# Patient Record
Sex: Male | Born: 1995 | Race: White | Hispanic: No | Marital: Married | Smoking: Never smoker
Health system: Southern US, Community
[De-identification: ages and names within clinical notes are randomized; demographics above are authoritative.]

## PROBLEM LIST (undated history)

## (undated) DIAGNOSIS — N289 Disorder of kidney and ureter, unspecified: Secondary | ICD-10-CM

---

## 2017-12-25 ENCOUNTER — Emergency Department (HOSPITAL_COMMUNITY)
Admission: EM | Admit: 2017-12-25 | Discharge: 2017-12-26 | Disposition: A | Discharge status: Home / Self Care | Payer: BLUE CROSS/BLUE SHIELD | Attending: Emergency Medicine | Admitting: Emergency Medicine

## 2017-12-25 ENCOUNTER — Encounter (HOSPITAL_COMMUNITY): Payer: Self-pay | Admitting: Emergency Medicine

## 2017-12-25 DIAGNOSIS — N289 Disorder of kidney and ureter, unspecified: Secondary | ICD-10-CM | POA: Insufficient documentation

## 2017-12-25 DIAGNOSIS — R1031 Right lower quadrant pain: Secondary | ICD-10-CM | POA: Diagnosis present

## 2017-12-25 HISTORY — DX: Disorder of kidney and ureter, unspecified: N28.9

## 2017-12-25 LAB — COMPREHENSIVE METABOLIC PANEL
ALT: 191 U/L — AB (ref 0–44)
AST: 69 U/L — AB (ref 15–41)
Albumin: 4.2 g/dL (ref 3.5–5.0)
Alkaline Phosphatase: 73 U/L (ref 38–126)
Anion gap: 8 (ref 5–15)
BUN: 20 mg/dL (ref 6–20)
CHLORIDE: 109 mmol/L (ref 98–111)
CO2: 23 mmol/L (ref 22–32)
CREATININE: 1.09 mg/dL (ref 0.61–1.24)
Calcium: 10.1 mg/dL (ref 8.9–10.3)
Glucose, Bld: 102 mg/dL — ABNORMAL HIGH (ref 70–99)
POTASSIUM: 4 mmol/L (ref 3.5–5.1)
SODIUM: 140 mmol/L (ref 135–145)
Total Bilirubin: 0.4 mg/dL (ref 0.3–1.2)
Total Protein: 6.9 g/dL (ref 6.5–8.1)

## 2017-12-25 LAB — URINALYSIS, ROUTINE W REFLEX MICROSCOPIC
Bilirubin Urine: NEGATIVE
GLUCOSE, UA: NEGATIVE mg/dL
Hgb urine dipstick: NEGATIVE
Ketones, ur: NEGATIVE mg/dL
LEUKOCYTES UA: NEGATIVE
NITRITE: NEGATIVE
PH: 5 (ref 5.0–8.0)
Protein, ur: NEGATIVE mg/dL
SPECIFIC GRAVITY, URINE: 1.025 (ref 1.005–1.030)

## 2017-12-25 LAB — CBC
HEMATOCRIT: 46.7 % (ref 39.0–52.0)
HEMOGLOBIN: 15.7 g/dL (ref 13.0–17.0)
MCH: 28.7 pg (ref 26.0–34.0)
MCHC: 33.6 g/dL (ref 30.0–36.0)
MCV: 85.4 fL (ref 80.0–100.0)
NRBC: 0 % (ref 0.0–0.2)
Platelets: 340 10*3/uL (ref 150–400)
RBC: 5.47 MIL/uL (ref 4.22–5.81)
RDW: 12.3 % (ref 11.5–15.5)
WBC: 8.1 10*3/uL (ref 4.0–10.5)

## 2017-12-25 LAB — LIPASE, BLOOD: LIPASE: 27 U/L (ref 11–51)

## 2017-12-25 NOTE — ED Triage Notes (Signed)
Pt presents to ED for assessment of RLQ pain x 3 days, worse with movement and ambulation.  Small amount of diarrhea, denies changes in urination, c/o intermittent nausea, denies vomiting.

## 2017-12-26 ENCOUNTER — Emergency Department (HOSPITAL_COMMUNITY): Payer: BLUE CROSS/BLUE SHIELD

## 2017-12-26 MED ORDER — IOHEXOL 300 MG/ML  SOLN
100.0000 mL | Freq: Once | INTRAMUSCULAR | Status: AC | PRN
  Administered 2017-12-26: 100 mL via INTRAVENOUS

## 2017-12-26 NOTE — ED Provider Notes (Signed)
[facility] EMERGENCY DEPARTMENT Provider Note   CSN: [id number] Arrival date & time: 12/25/17  2132     History   Chief Complaint Chief Complaint  Patient presents with  . Abdominal Pain    HPI OSA CAMPOLI is a 22 y.o. male.  The history is provided by the patient and medical records.  Abdominal Pain      22 year old male presenting to the ED with right lower quadrant abdominal pain.  Has been ongoing for about 3 days, but got acutely worse today.  Pain is worse with movement and ambulation.  Has had a little bit of diarrhea with some intermittent nausea.  No vomiting.  Denies any difficulty urinating or hematuria.  He has not had any fever or chills.  He was seen at urgent care today and sent to the ED for CT scan with concern of appendicitis.  No prior abdominal surgeries.  No meds taken prior to arrival.  Past Medical History:  Diagnosis Date  . Renal disorder    ureter    There are no active problems to display for this patient.   History reviewed. No pertinent surgical history.      Home Medications    Prior to Admission medications   Not on File    Family History History reviewed. No pertinent family history.  Social History Social History   Tobacco Use  . Smoking status: Never Smoker  . Smokeless tobacco: Never Used  Substance Use Topics  . Alcohol use: Not Currently  . Drug use: Never     Allergies   Patient has no allergy information on record.   Review of Systems Review of Systems  Gastrointestinal: Positive for abdominal pain.  All other systems reviewed and are negative.    Physical Exam Updated Vital Signs BP (!) 147/98 (BP Location: Left Arm)   Pulse 78   Temp 98.1 F (36.7 C) (Oral)   Resp 16   SpO2 94%   Physical Exam  Constitutional: He is oriented to person, place, and time. He appears well-developed and well-nourished.  HENT:  Head: Normocephalic and atraumatic.  Mouth/Throat: Oropharynx is  clear and moist.  Eyes: Pupils are equal, round, and reactive to light. Conjunctivae and EOM are normal.  Neck: Normal range of motion.  Cardiovascular: Normal rate, regular rhythm and normal heart sounds.  Pulmonary/Chest: Effort normal and breath sounds normal.  Abdominal: Soft. Bowel sounds are normal. There is tenderness (mild) in the right lower quadrant. There is no rigidity and no guarding.  Musculoskeletal: Normal range of motion.  Neurological: He is alert and oriented to person, place, and time.  Skin: Skin is warm and dry.  Psychiatric: He has a normal mood and affect.  Nursing note and vitals reviewed.    ED Treatments / Results  Labs (all labs ordered are listed, but only abnormal results are displayed) Labs Reviewed  COMPREHENSIVE METABOLIC PANEL - Abnormal; Notable for the following components:      Result Value   Glucose, Bld 102 (*)    AST 69 (*)    ALT 191 (*)    All other components within normal limits  LIPASE, BLOOD  CBC  URINALYSIS, ROUTINE W REFLEX MICROSCOPIC    EKG None  Radiology Ct Abdomen Pelvis W Contrast  Result Date: 12/26/2017 CLINICAL DATA:  Right lower quadrant pain for 3 days. EXAM: CT ABDOMEN AND PELVIS WITH CONTRAST TECHNIQUE: Multidetector CT imaging of the abdomen and pelvis was performed using the standard protocol  following bolus administration of intravenous contrast. CONTRAST:  OMNIPAQUE IOHEXOL 300 MG/ML  SOLN COMPARISON:  None. FINDINGS: Lower chest: The lung bases are clear. Hepatobiliary: Prominent diffuse fatty infiltration of the liver. No focal lesions. Gallbladder and bile ducts are unremarkable. Pancreas: Unremarkable. No pancreatic ductal dilatation or surrounding inflammatory changes. Spleen: Normal in size without focal abnormality. Adrenals/Urinary Tract: No adrenal gland nodules. Kidneys are symmetrical with homogeneous nephrograms. Prominent extrarenal pelvises bilaterally. No hydronephrosis or hydroureter. Bladder is  unremarkable. Stomach/Bowel: Stomach is within normal limits. Appendix appears normal. No evidence of bowel wall thickening, distention, or inflammatory changes. Vascular/Lymphatic: No significant vascular findings are present. No enlarged abdominal or pelvic lymph nodes. Reproductive: Prostate is unremarkable. Other: No abdominal wall hernia or abnormality. No abdominopelvic ascites. Musculoskeletal: No acute or significant osseous findings. IMPRESSION: 1. Prominent diffuse fatty infiltration of the liver. 2. Normal appendix. 3. Prominent extrarenal pelvises bilaterally without hydronephrosis or hydroureter. 4. No bowel obstruction or inflammation. Electronically Signed   By: Burman Nieves M.D.   On: 12/26/2017 03:18    Procedures Procedures (including critical care time)  Medications Ordered in ED Medications - No data to display   Initial Impression / Assessment and Plan / ED Course  I have reviewed the triage vital signs and the nursing notes.  Pertinent labs & imaging results that were available during my care of the patient were reviewed by me and considered in my medical decision making (see chart for details).  22 year old male presenting to the ED with right lower abdominal pain for the past 3 days.  He is afebrile and nontoxic.  Exam with very minimal tenderness in the right lower abdomen.  No peritoneal signs.  Labs reviewed, does have some elevation of LFTs.  He reports history of same, seen by GI and felt to be related to fatty liver.  He denies any excessive Tylenol or alcohol intake.  He is not on a statin.  He denies any recent travel or risk factors for hepatitis.  No hx of IVDU.  Will plan for CT scan.  CT scan without acute findings.  Does show signs of fatty liver.  Patient stable for discharge.  Offered pain medications for symptomatic control, patient declined.  Will follow-up with his GI doctor for recheck of LFT's in the next few weeks.  Return here for any new/acute  changes.  Final Clinical Impressions(s) / ED Diagnoses   Final diagnoses:  RLQ abdominal pain    ED Discharge Orders    None       Garlon Hatchet, PA-C 12/26/17 1610    Gilda Crease, MD 12/27/17 [phone]

## 2017-12-26 NOTE — Discharge Instructions (Signed)
CT today was normal. Liver enzymes were elevated today-- re-check in a few weeks for monitoring. Return here for any new/acute changes.

## 2019-08-01 IMAGING — CT CT ABD-PELV W/ CM
2 of 4 series · 17 of 46 positions shown, 19 images · IV contrast (APPLIED)
Comparison: None.

CLINICAL DATA: Right lower quadrant pain for 3 days.

EXAM:
CT ABDOMEN AND PELVIS WITH CONTRAST
TECHNIQUE: Multidetector CT imaging of the abdomen and pelvis was performed
using the standard protocol following bolus administration of
intravenous contrast.
CONTRAST:  100mL OMNIPAQUE IOHEXOL 300 MG/ML  SOLN

[Series 3: abdomen 5.0 · axial · 0.81mm/px · z∈[+880,+1345]mm · 14 of 105 slices shown, 16 images]
[im 6/105  soft-tissue]
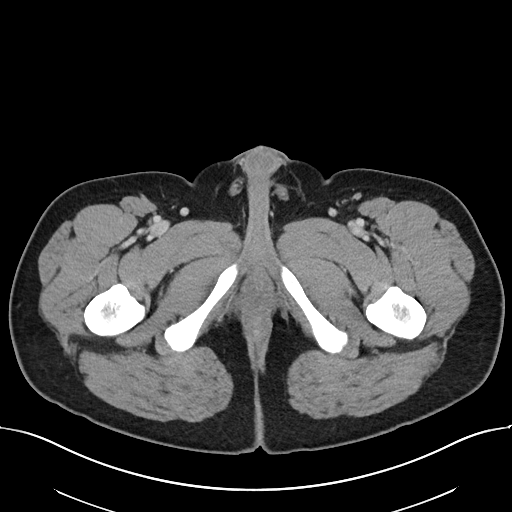
[im 6/105  bone]
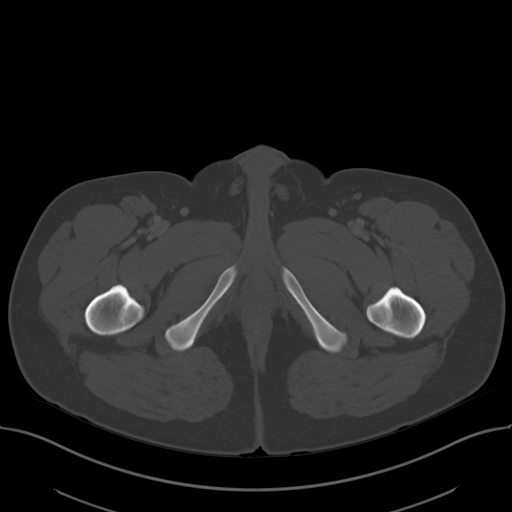
[im 12/105  soft-tissue]
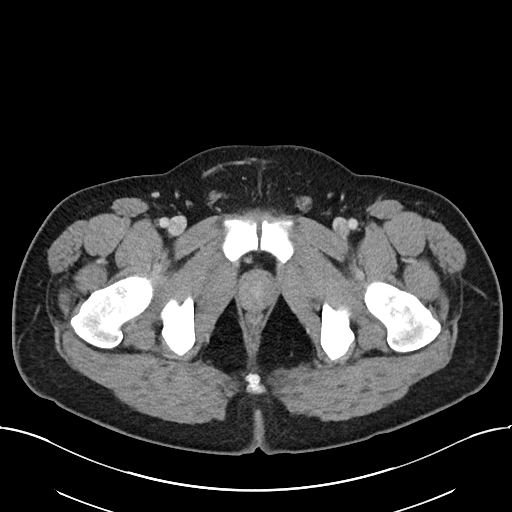
[im 24/105  soft-tissue]
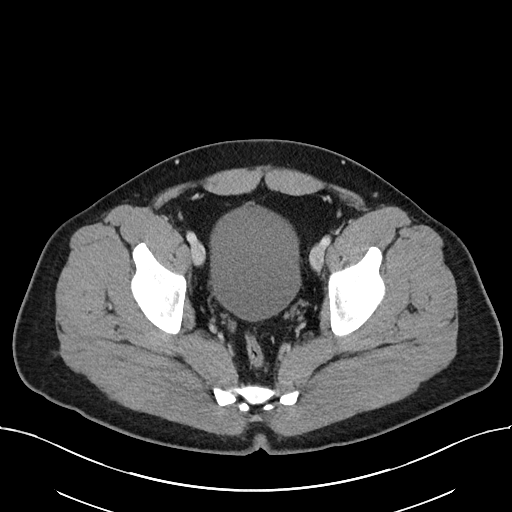
[im 29/105  soft-tissue]
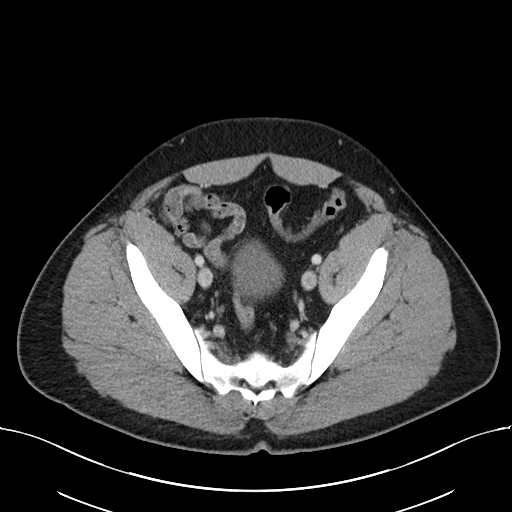
[im 35/105  soft-tissue]
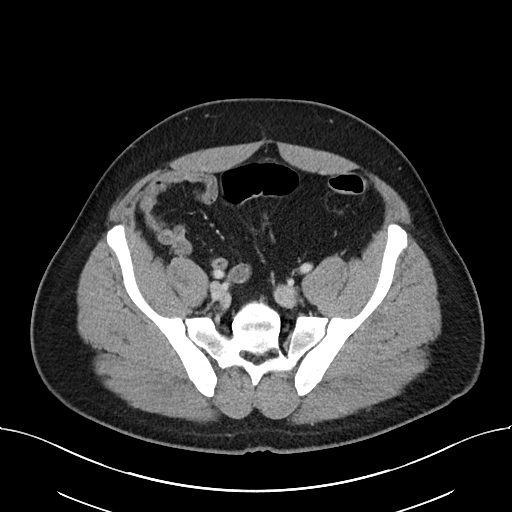
[im 41/105  soft-tissue]
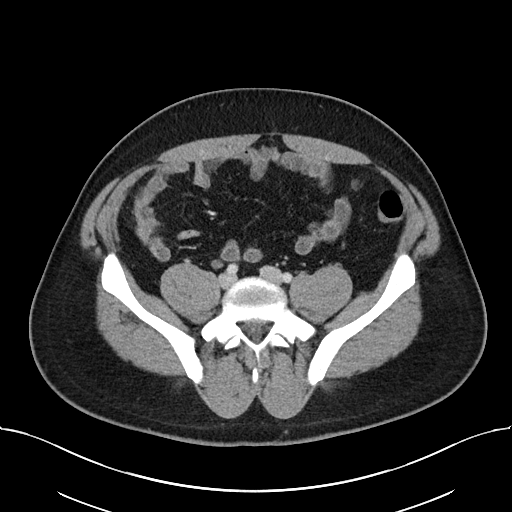
[im 47/105  soft-tissue]
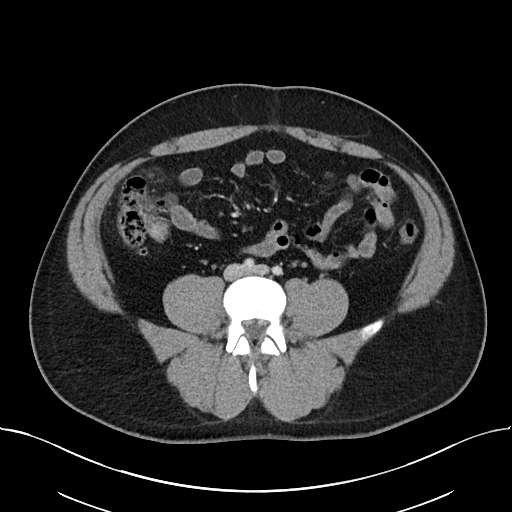
[im 58/105  soft-tissue]
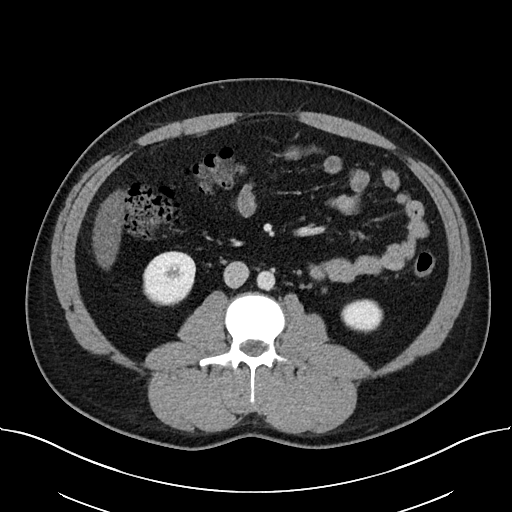
[im 64/105  soft-tissue]
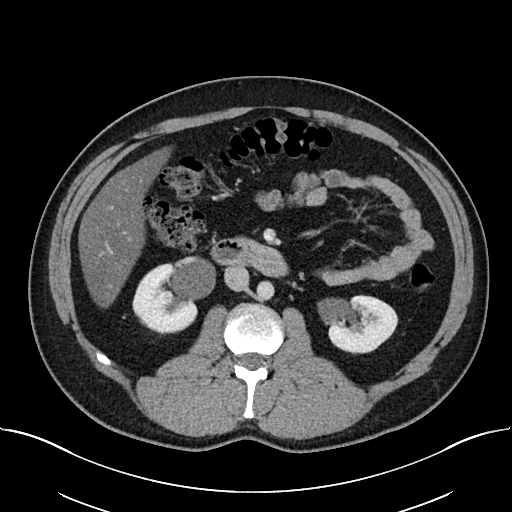
[im 64/105  bone]
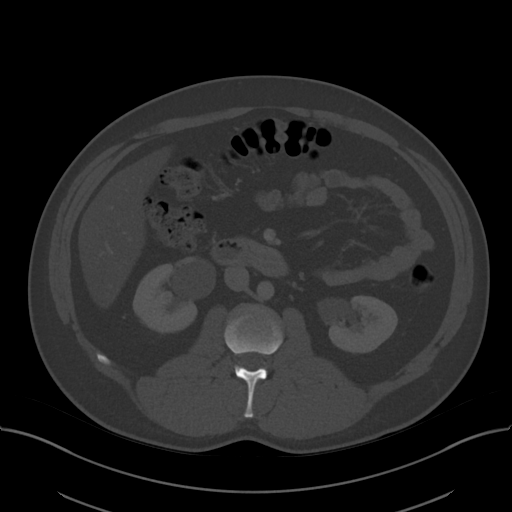
[im 70/105  soft-tissue]
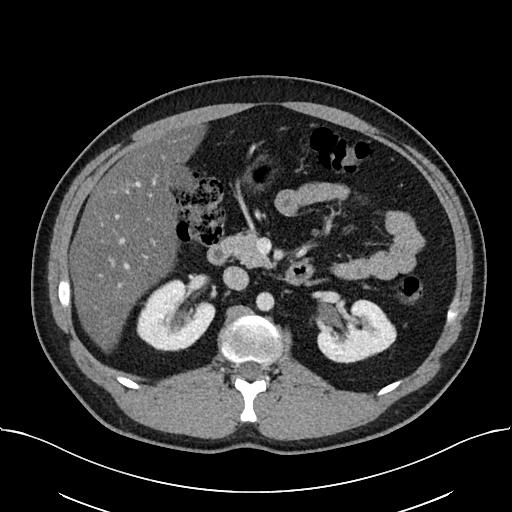
[im 76/105  soft-tissue]
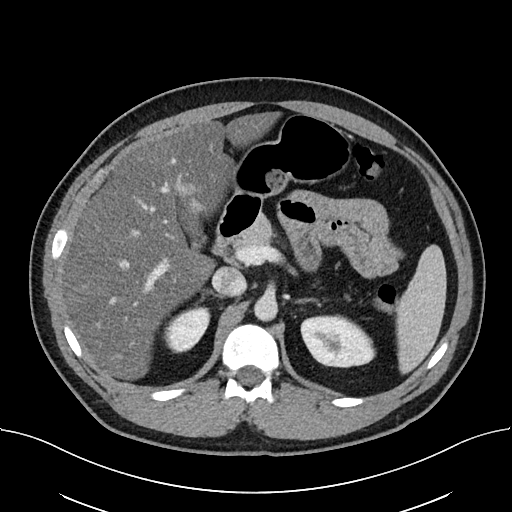
[im 81/105  soft-tissue]
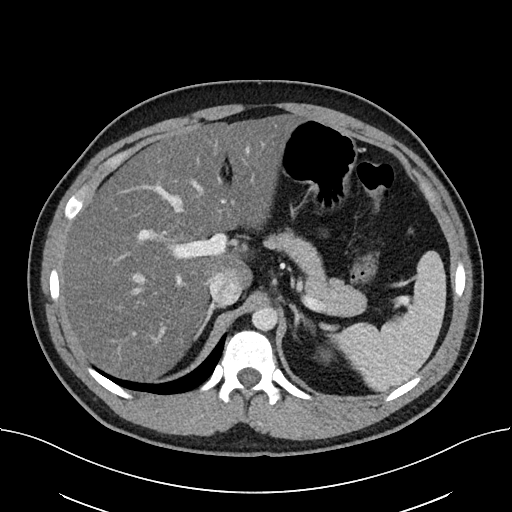
[im 93/105  soft-tissue]
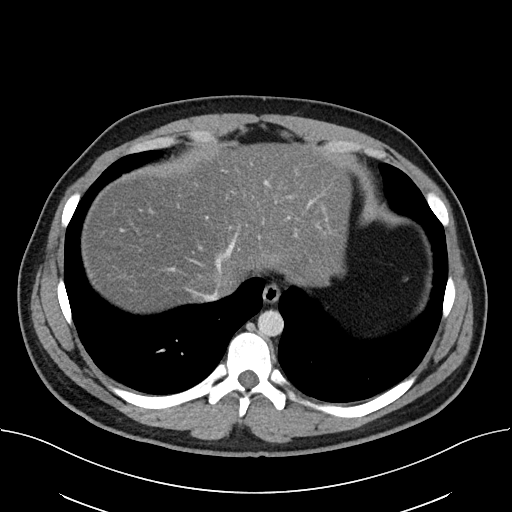
[im 99/105  soft-tissue]
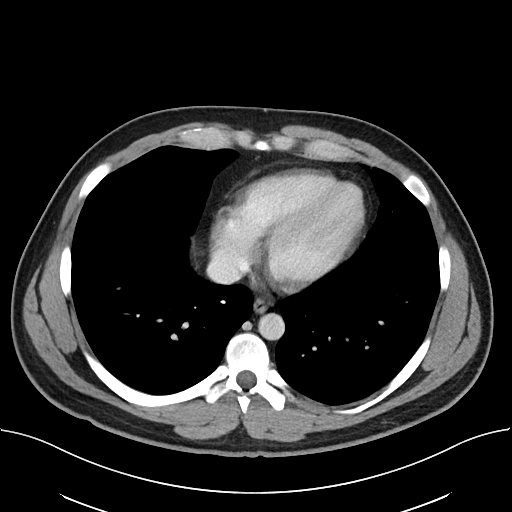

[Series 6: abdomen 3.0 mpr cor · coronal · 0.92mm/px · 3 of 101 slices shown]
[im 34/101  soft-tissue]
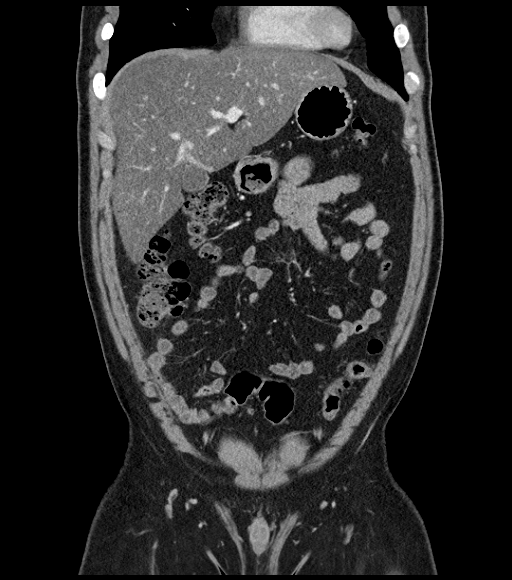
[im 45/101  soft-tissue]
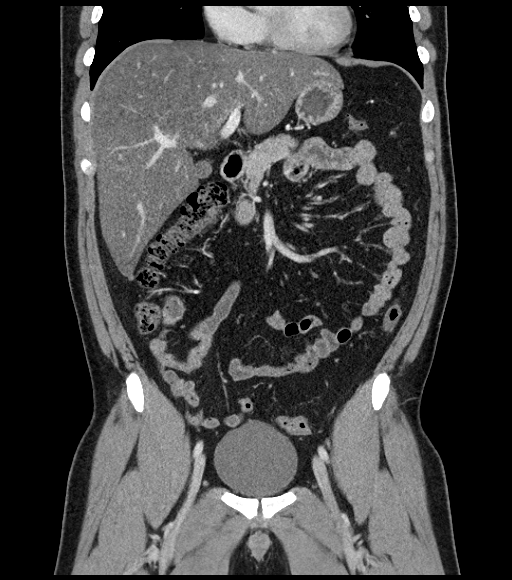
[im 56/101  soft-tissue]
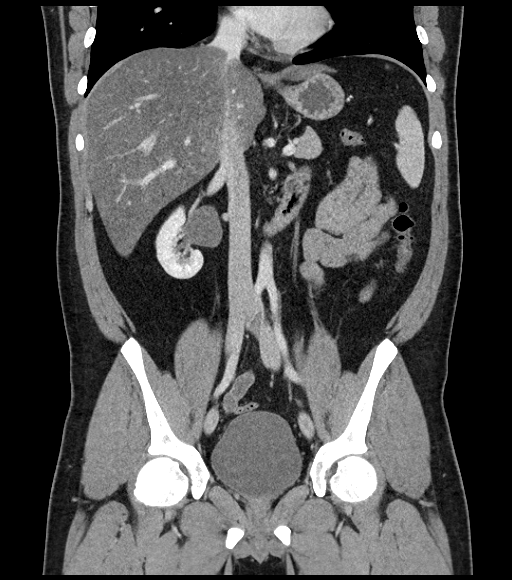

[17 of 46 positions shown; findings below may reference images not displayed]

FINDINGS: Lower chest: The lung bases are clear.

Hepatobiliary: Prominent diffuse fatty infiltration of the liver. No
focal lesions. Gallbladder and bile ducts are unremarkable.

Pancreas: Unremarkable. No pancreatic ductal dilatation or
surrounding inflammatory changes.

Spleen: Normal in size without focal abnormality.

Adrenals/Urinary Tract: No adrenal gland nodules. Kidneys are
symmetrical with homogeneous nephrograms. Prominent extrarenal
pelvises bilaterally. No hydronephrosis or hydroureter. Bladder is
unremarkable.

Stomach/Bowel: Stomach is within normal limits. Appendix appears
normal. No evidence of bowel wall thickening, distention, or
inflammatory changes.

Vascular/Lymphatic: No significant vascular findings are present. No
enlarged abdominal or pelvic lymph nodes.

Reproductive: Prostate is unremarkable.

Other: No abdominal wall hernia or abnormality. No abdominopelvic
ascites.

Musculoskeletal: No acute or significant osseous findings.
IMPRESSION: 1. Prominent diffuse fatty infiltration of the liver.
2. Normal appendix.
3. Prominent extrarenal pelvises bilaterally without hydronephrosis
or hydroureter.
4. No bowel obstruction or inflammation.

## 2022-06-08 ENCOUNTER — Encounter: Payer: Self-pay | Admitting: Adult Health

## 2022-06-08 ENCOUNTER — Ambulatory Visit (INDEPENDENT_AMBULATORY_CARE_PROVIDER_SITE_OTHER): Payer: 59 | Admitting: Adult Health

## 2022-06-08 VITALS — BP 129/89 | HR 71 | Ht 70.5 in | Wt 223.0 lb

## 2022-06-08 DIAGNOSIS — F429 Obsessive-compulsive disorder, unspecified: Secondary | ICD-10-CM | POA: Diagnosis not present

## 2022-06-08 MED ORDER — FLUOXETINE HCL 10 MG PO CAPS: 10.0000 mg | ORAL_CAPSULE | Freq: Every day | ORAL | 2 refills | Status: DC

## 2022-06-08 NOTE — Progress Notes (Signed)
[facility] MD/PA/NP Initial Note  06/08/2022 1:23 PM Justin Krueger  MRN:  [id number]  Chief Complaint:   HPI:   Patient seen today for initial psychiatric evaluation.   Describes mood today as - Mood symptoms - denies depression - "nothing significant" - "not usually" - "some down days". Reports a significant amount of anxiety every day. Stating "I am more anxious than others maybe".Denies panic attacks. Reports irrational and intrusive thoughts. Reports some worry, rumination, and over thinking. Reports irritability at times. Has constructed a lot of rules and is rigid with the rules. Reports a lot of hand washing after any type of use. Checking things a lot. Has been particular about what his child eats - looking at expirations dates - no dented cans. Wants animals to vaccinated. Feels like he enforces his rules on others - mother visiting. Feels like he is "inflexible" in certain situations - "it's not universal". Feels like he was more cautious during Covid. Reports tension with family members since having daughter. Reports behaviors prior to daughter - was more concerned when they got their dog. Was meticulous in work setting. Feels like the more he cares about something, the more rules he will create to protect it. Reports mother, sister, and wife are concerned about his rigidity. He doesn't see it as impactful as his family does. Reports rigidity - rule oriented throughout childhood. Feels like family would support a diagnosis of OCD. Stable interest and motivation. Taking medications as prescribed.  Energy levels are up and down. Active, has a regular exercise routine.   Enjoys some usual interests and activities. Married. Lives with wife and daughter and dog. Spending time with family. Appetite adequate. Weight stable. Sleeps well most nights. Averages 6 to 8 hours.  Focus and concentration stable. Completing tasks. Managing aspects of household. 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  Denies SI or HI.  Denies AH or VH. Denies self harm. Denies substance use.  Previous medication trials:  Denies.  Visit Diagnosis: No diagnosis found.  Past Psychiatric History: Denies any family history of mental illness.   Past Medical History:  Past Medical History:  Diagnosis Date   Renal disorder    ureter   No past surgical history on file.  Family Psychiatric History: Denies any family history of mental illness.   Family History: No family history on file.  Social History:  Social History   Socioeconomic History   Marital status: Married    Spouse name: Not on file   Number of children: Not on file   Years of education: Not on file   Highest education level: Not on file  Occupational History   Not on file  Tobacco Use   Smoking status: Never   Smokeless tobacco: Never  Substance and Sexual Activity   Alcohol use: Not Currently   Drug use: Never   Sexual activity: Not on file  Other Topics Concern   Not on file  Social History Narrative   Not on file   Social Determinants of Health   Financial Resource Strain: Not on file  Food Insecurity: Not on file  Transportation Needs: Not on file  Physical Activity: Not on file  Stress: Not on file  Social Connections: Not on file    Allergies: Not on File  Metabolic Disorder Labs: No results found for: "HGBA1C", "MPG" No results found for: "PROLACTIN" No results found for: "CHOL", "TRIG", "HDL", "CHOLHDL", "VLDL", "LDLCALC" No results found for: "TSH"  Therapeutic Level  Labs: No results found for: "LITHIUM" No results found for: "VALPROATE" No results found for: "CBMZ"  Current Medications: No current outpatient medications on file.   No current facility-administered medications for this visit.    Medication Side Effects: none  Orders placed this visit:  No orders of the defined types were placed in this encounter.   Psychiatric Specialty Exam:  Review of Systems   Musculoskeletal:  Negative for gait problem.  Neurological:  Negative for tremors.  Psychiatric/Behavioral:         Please refer to HPI    There were no vitals taken for this visit.There is no height or weight on file to calculate BMI.  General Appearance: Casual and Neat  Eye Contact:  Good  Speech:  Clear and Coherent and Normal Rate  Volume:  Normal  Mood:  Euthymic  Affect:  Appropriate and Congruent  Thought Process:  Coherent and Descriptions of Associations: Intact  Orientation:  Full (Time, Place, and Person)  Thought Content: Logical   Suicidal Thoughts:  No  Homicidal Thoughts:  No  Memory:  WNL  Judgement:  Good  Insight:  Good  Psychomotor Activity:  Normal  Concentration:  Concentration: Good and Attention Span: Good  Recall:  Good  Fund of Knowledge: Good  Language: Good  Assets:  Communication Skills Desire for Improvement Financial Resources/Insurance Housing Intimacy Leisure Time Physical Health Resilience Social Support Talents/Skills Transportation Vocational/Educational  ADL's:  Intact  Cognition: WNL  Prognosis:  Good   Screenings: MDQ  Receiving Psychotherapy: No   Treatment Plan/Recommendations:   Plan:  PDMP reviewed  Add Prozac 10mg  daily  Patient seen for 30 minutes and time spent discussing treatment options.  RTC 4 weeks  Patient advised to contact office with any questions, adverse effects, or acute worsening in signs and symptoms.   Aloha Gell, NP

## 2022-06-30 ENCOUNTER — Other Ambulatory Visit: Payer: Self-pay | Admitting: Adult Health

## 2022-06-30 DIAGNOSIS — F429 Obsessive-compulsive disorder, unspecified: Secondary | ICD-10-CM

## 2022-07-09 ENCOUNTER — Ambulatory Visit: Payer: 59 | Admitting: Adult Health

## 2022-07-18 ENCOUNTER — Ambulatory Visit (INDEPENDENT_AMBULATORY_CARE_PROVIDER_SITE_OTHER): Payer: Self-pay | Admitting: Adult Health

## 2022-07-18 DIAGNOSIS — Z0389 Encounter for observation for other suspected diseases and conditions ruled out: Secondary | ICD-10-CM

## 2022-07-18 NOTE — Progress Notes (Signed)
Patient no show appointment. ? ?

## 2022-08-03 ENCOUNTER — Encounter: Payer: Self-pay | Admitting: Adult Health

## 2022-08-03 ENCOUNTER — Telehealth (INDEPENDENT_AMBULATORY_CARE_PROVIDER_SITE_OTHER): Payer: 59 | Admitting: Adult Health

## 2022-08-03 DIAGNOSIS — F429 Obsessive-compulsive disorder, unspecified: Secondary | ICD-10-CM | POA: Diagnosis not present

## 2022-08-03 MED ORDER — FLUOXETINE HCL 10 MG PO CAPS: 10.0000 mg | ORAL_CAPSULE | Freq: Every day | ORAL | 2 refills | Status: DC

## 2022-08-03 NOTE — Progress Notes (Signed)
Justin Krueger [id number] Dec 19, 1995 27 y.o.  Virtual Visit via Video Note  I connected with pt @ on 08/03/22 at  8:40 AM EDT by a video enabled telemedicine application and verified that I am speaking with the correct person using two identifiers.   I discussed the limitations of evaluation and management by telemedicine and the availability of in person appointments. The patient expressed understanding and agreed to proceed.  I discussed the assessment and treatment plan with the patient. The patient was provided an opportunity to ask questions and all were answered. The patient agreed with the plan and demonstrated an understanding of the instructions.   The patient was advised to call back or seek an in-person evaluation if the symptoms worsen or if the condition fails to improve as anticipated.  I provided 10 minutes of non-face-to-face time during this encounter.  The patient was located at home.  The provider was located at [facility].   Justin Gibbs, NP   Subjective:   Patient ID:  Justin Krueger is a 27 y.o. (DOB 13-Jun-1995) male.  Chief Complaint: No chief complaint on file.   HPI Justin Krueger presents for follow-up of OCD.  Describes mood today as "better". Pleasant. Denies tearfulness. Mood symptoms - denies depression. Reports decreased anxiety. Denies panic attacks. Reports decreased irrational and intrusive thoughts. Reports decreased obsessive thoughts and acts. Reports decreased worry, rumination, and over thinking. Reports irritability at times. Mood is consistent. Stating "I feel better". Reports an improvement with the Prozac. Stable interest and motivation. Taking medications as prescribed.  Energy levels improved. Active, does not have a regular exercise routine.   Enjoys some usual interests and activities. Married. Lives with wife and daughter and dog. Spending time with family. Appetite adequate. Weight stable. Sleeps well most nights.  Averages 7 to 9 hours.  Focus and concentration stable. Completing tasks. Managing aspects of household. 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  Denies SI or HI.  Denies AH or VH. Denies self harm. Denies substance use.  Previous medication trials:  Denies.   Review of Systems:  Review of Systems  Musculoskeletal:  Negative for gait problem.  Neurological:  Negative for tremors.  Psychiatric/Behavioral:         Please refer to HPI    Medications: I have reviewed the patient's current medications.  Current Outpatient Medications  Medication Sig Dispense Refill   FLUoxetine (PROZAC) 10 MG capsule Take 1 capsule (10 mg total) by mouth daily. 30 capsule 2   No current facility-administered medications for this visit.    Medication Side Effects: None  Allergies:  Allergies  Allergen Reactions   Sulfa Antibiotics Rash   Sulfasalazine Rash    Past Medical History:  Diagnosis Date   Renal disorder    ureter    No family history on file.  Social History   Socioeconomic History   Marital status: Married    Spouse name: Not on file   Number of children: Not on file   Years of education: Not on file   Highest education level: Not on file  Occupational History   Not on file  Tobacco Use   Smoking status: Never   Smokeless tobacco: Never  Substance and Sexual Activity   Alcohol use: Not Currently   Drug use: Never   Sexual activity: Not on file  Other Topics Concern   Not on file  Social History Narrative   Not on file   Social Determinants  of Health   Financial Resource Strain: Not on file  Food Insecurity: Not on file  Transportation Needs: Not on file  Physical Activity: Not on file  Stress: Not on file  Social Connections: Not on file  Intimate Partner Violence: Not on file    Past Medical History, Surgical history, Social history, and Family history were reviewed and updated as appropriate.   Please see review of systems for further  details on the patient's review from today.   Objective:   Physical Exam:  There were no vitals taken for this visit.  Physical Exam Constitutional:      General: He is not in acute distress. Musculoskeletal:        General: No deformity.  Neurological:     Mental Status: He is alert and oriented to person, place, and time.     Coordination: Coordination normal.  Psychiatric:        Attention and Perception: Attention and perception normal. He does not perceive auditory or visual hallucinations.        Mood and Affect: Mood normal. Mood is not anxious or depressed. Affect is not labile, blunt, angry or inappropriate.        Speech: Speech normal.        Behavior: Behavior normal.        Thought Content: Thought content normal. Thought content is not paranoid or delusional. Thought content does not include homicidal or suicidal ideation. Thought content does not include homicidal or suicidal plan.        Cognition and Memory: Cognition and memory normal.        Judgment: Judgment normal.     Comments: Insight intact     Lab Review:     Component Value Date/Time   NA 140 12/25/2017 2228   K 4.0 12/25/2017 2228   CL 109 12/25/2017 2228   CO2 23 12/25/2017 2228   GLUCOSE 102 (H) 12/25/2017 2228   BUN 20 12/25/2017 2228   CREATININE 1.09 12/25/2017 2228   CALCIUM 10.1 12/25/2017 2228   PROT 6.9 12/25/2017 2228   ALBUMIN 4.2 12/25/2017 2228   AST 69 (H) 12/25/2017 2228   ALT 191 (H) 12/25/2017 2228   ALKPHOS 73 12/25/2017 2228   BILITOT 0.4 12/25/2017 2228   GFRNONAA >60 12/25/2017 2228   GFRAA >60 12/25/2017 2228       Component Value Date/Time   WBC 8.1 12/25/2017 2228   RBC 5.47 12/25/2017 2228   HGB 15.7 12/25/2017 2228   HCT 46.7 12/25/2017 2228   PLT 340 12/25/2017 2228   MCV 85.4 12/25/2017 2228   MCH 28.7 12/25/2017 2228   MCHC 33.6 12/25/2017 2228   RDW 12.3 12/25/2017 2228    No results found for: "POCLITH", "LITHIUM"   No results found for:  "PHENYTOIN", "PHENOBARB", "VALPROATE", "CBMZ"   .res Assessment: Plan:    Plan:  PDMP reviewed  Prozac 10mg  daily - may call between visits to increase dose to 20mg  daily.  RTC 3 months  Patient advised to contact office with any questions, adverse effects, or acute worsening in signs and symptoms.  There are no diagnoses linked to this encounter.   Please see After Visit Summary for patient specific instructions.  Future Appointments  Date Time Provider Department Center  08/03/2022  8:40 AM Hollee Fate, Thereasa Solo, NP [facility] None    No orders of the defined types were placed in this encounter.     -------------------------------

## 2022-12-05 ENCOUNTER — Telehealth: Payer: 59 | Admitting: Adult Health

## 2022-12-05 ENCOUNTER — Encounter: Payer: Self-pay | Admitting: Adult Health

## 2022-12-05 DIAGNOSIS — F429 Obsessive-compulsive disorder, unspecified: Secondary | ICD-10-CM | POA: Diagnosis not present

## 2022-12-05 MED ORDER — FLUOXETINE HCL 20 MG PO CAPS: 20.0000 mg | ORAL_CAPSULE | Freq: Every day | ORAL | 1 refills | Status: DC

## 2022-12-05 NOTE — Progress Notes (Signed)
Justin Krueger [id number] 10/29/1995 27 y.o.  Virtual Visit via Video Note  I connected with pt @ on 12/05/22 at 10:00 AM EDT by a video enabled telemedicine application and verified that I am speaking with the correct person using two identifiers.   I discussed the limitations of evaluation and management by telemedicine and the availability of in person appointments. The patient expressed understanding and agreed to proceed.  I discussed the assessment and treatment plan with the patient. The patient was provided an opportunity to ask questions and all were answered. The patient agreed with the plan and demonstrated an understanding of the instructions.   The patient was advised to call back or seek an in-person evaluation if the symptoms worsen or if the condition fails to improve as anticipated.  I provided 10 minutes of non-face-to-face time during this encounter.  The patient was located at home.  The provider was located at [facility].   Dorothyann Gibbs, NP   Subjective:   Patient ID:  Justin Krueger is a 27 y.o. (DOB Jul 06, 1995) male.  Chief Complaint: No chief complaint on file.   HPI OTHON PANDOLFO presents for follow-up of OCD.  Describes mood today as "ok". Pleasant. Denies tearfulness. Mood symptoms - denies depression. Reports some anxiety and irritability. Denies panic attacks. Reports decreased irrational and intrusive thoughts. Reports obsessive thoughts and acts. Reports decreased worry, rumination, and over thinking. . Mood is consistent. Stating "I feel like I'm doing alright". Feels like Prozac works well. Stable interest and motivation. Taking medications as prescribed.  Energy levels improved. Active, does not have a regular exercise routine.   Enjoys some usual interests and activities. Married. Lives with wife and daughter (2) and dog. Spending time with family. Appetite adequate. Weight stable. Sleeps well most nights. Averages 7 to 9 hours.   Focus and concentration stable. Completing tasks. Managing aspects of household. 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  Denies SI or HI.  Denies AH or VH. Denies self harm. Denies substance use.  Previous medication trials:  Denies.    Review of Systems:  Review of Systems  Musculoskeletal:  Negative for gait problem.  Neurological:  Negative for tremors.  Psychiatric/Behavioral:         Please refer to HPI    Medications: I have reviewed the patient's current medications.  Current Outpatient Medications  Medication Sig Dispense Refill   FLUoxetine (PROZAC) 10 MG capsule Take 1 capsule (10 mg total) by mouth daily. 30 capsule 2   No current facility-administered medications for this visit.    Medication Side Effects: None  Allergies:  Allergies  Allergen Reactions   Sulfa Antibiotics Rash    hives   Sulfasalazine Rash    Past Medical History:  Diagnosis Date   Renal disorder    ureter    No family history on file.  Social History   Socioeconomic History   Marital status: Married    Spouse name: Not on file   Number of children: Not on file   Years of education: Not on file   Highest education level: Not on file  Occupational History   Not on file  Tobacco Use   Smoking status: Never   Smokeless tobacco: Never  Substance and Sexual Activity   Alcohol use: Not Currently   Drug use: Never   Sexual activity: Not on file  Other Topics Concern   Not on file  Social History Narrative   Not on  file   Social Determinants of Health   Financial Resource Strain: Not on file  Food Insecurity: Not on file  Transportation Needs: Not on file  Physical Activity: Not on file  Stress: Not on file  Social Connections: Not on file  Intimate Partner Violence: Not on file    Past Medical History, Surgical history, Social history, and Family history were reviewed and updated as appropriate.   Please see review of systems for further details on  the patient's review from today.   Objective:   Physical Exam:  There were no vitals taken for this visit.  Physical Exam Constitutional:      General: He is not in acute distress. Musculoskeletal:        General: No deformity.  Neurological:     Mental Status: He is alert and oriented to person, place, and time.     Coordination: Coordination normal.  Psychiatric:        Attention and Perception: Attention and perception normal. He does not perceive auditory or visual hallucinations.        Mood and Affect: Mood normal. Mood is not anxious or depressed. Affect is not labile, blunt, angry or inappropriate.        Speech: Speech normal.        Behavior: Behavior normal.        Thought Content: Thought content normal. Thought content is not paranoid or delusional. Thought content does not include homicidal or suicidal ideation. Thought content does not include homicidal or suicidal plan.        Cognition and Memory: Cognition and memory normal.        Judgment: Judgment normal.     Comments: Insight intact     Lab Review:     Component Value Date/Time   NA 140 12/25/2017 2228   K 4.0 12/25/2017 2228   CL 109 12/25/2017 2228   CO2 23 12/25/2017 2228   GLUCOSE 102 (H) 12/25/2017 2228   BUN 20 12/25/2017 2228   CREATININE 1.09 12/25/2017 2228   CALCIUM 10.1 12/25/2017 2228   PROT 6.9 12/25/2017 2228   ALBUMIN 4.2 12/25/2017 2228   AST 69 (H) 12/25/2017 2228   ALT 191 (H) 12/25/2017 2228   ALKPHOS 73 12/25/2017 2228   BILITOT 0.4 12/25/2017 2228   GFRNONAA >60 12/25/2017 2228   GFRAA >60 12/25/2017 2228       Component Value Date/Time   WBC 8.1 12/25/2017 2228   RBC 5.47 12/25/2017 2228   HGB 15.7 12/25/2017 2228   HCT 46.7 12/25/2017 2228   PLT 340 12/25/2017 2228   MCV 85.4 12/25/2017 2228   MCH 28.7 12/25/2017 2228   MCHC 33.6 12/25/2017 2228   RDW 12.3 12/25/2017 2228    No results found for: "POCLITH", "LITHIUM"   No results found for: "PHENYTOIN",  "PHENOBARB", "VALPROATE", "CBMZ"   .res Assessment: Plan:    Plan:  PDMP reviewed  Prozac 20mg  daily   RTC 3/6 months  Patient advised to contact office with any questions, adverse effects, or acute worsening in signs and symptoms.  There are no diagnoses linked to this encounter.   Please see After Visit Summary for patient specific instructions.  Future Appointments  Date Time Provider Department Center  12/05/2022 10:00 AM Preslea Rhodus, Thereasa Solo, NP [facility] None    No orders of the defined types were placed in this encounter.     -------------------------------

## 2023-06-03 ENCOUNTER — Telehealth: Payer: 59 | Admitting: Adult Health

## 2023-06-03 ENCOUNTER — Telehealth (INDEPENDENT_AMBULATORY_CARE_PROVIDER_SITE_OTHER): Payer: 59 | Admitting: Adult Health

## 2023-06-03 ENCOUNTER — Encounter: Payer: Self-pay | Admitting: Adult Health

## 2023-06-03 DIAGNOSIS — F429 Obsessive-compulsive disorder, unspecified: Secondary | ICD-10-CM

## 2023-06-03 MED ORDER — FLUOXETINE HCL 20 MG PO CAPS: 20.0000 mg | ORAL_CAPSULE | Freq: Every day | ORAL | 1 refills | Status: AC

## 2023-06-03 NOTE — Progress Notes (Signed)
 Justin Krueger [id number] 1995-05-22 28 y.o.  Virtual Visit via Video Note  I connected with pt @ on 06/03/23 at 10:30 AM EDT by a video enabled telemedicine application and verified that I am speaking with the correct person using two identifiers.   I discussed the limitations of evaluation and management by telemedicine and the availability of in person appointments. The patient expressed understanding and agreed to proceed.  I discussed the assessment and treatment plan with the patient. The patient was provided an opportunity to ask questions and all were answered. The patient agreed with the plan and demonstrated an understanding of the instructions.   The patient was advised to call back or seek an in-person evaluation if the symptoms worsen or if the condition fails to improve as anticipated.  I provided 10 minutes of non-face-to-face time during this encounter.  The patient was located at home.  The provider was located at [facility].   Dorothyann Gibbs, NP   Subjective:   Patient ID:  Justin Krueger is a 28 y.o. (DOB 07-Jul-1995) male.  Chief Complaint: No chief complaint on file.   HPI Justin Krueger presents for follow-up of OCD.  Describes mood today as "ok". Pleasant. Denies tearfulness. Mood symptoms - denies depression. Reports stable interest and motivation. Reports "some" anxiety and irritability. Denies panic attacks. Reports some irrational and intrusive thoughts. Denies obsessive thoughts and acts. Reports decreased worry, rumination and over thinking. Mood is consistent. Stating "I feel like I'm doing ok for the most part". Feels like Prozac works well.  Taking medications as prescribed.  Energy levels improved. Active, has a regular exercise routine.   Enjoys some usual interests and activities. Married. Lives with wife and daughter, dog. Spending time with family. Appetite adequate. Weight loss. Sleeps well most nights. Averages 6 to 7 hours.  Focus  and concentration stable. Completing tasks. Managing aspects of household. Unemployed -  Denies SI or HI.  Denies AH or VH. Denies self harm. Denies substance use.  Previous medication trials:  Denies.    Review of Systems:  Review of Systems  Musculoskeletal:  Negative for gait problem.  Neurological:  Negative for tremors.  Psychiatric/Behavioral:         Please refer to HPI    Medications: I have reviewed the patient's current medications.  Current Outpatient Medications  Medication Sig Dispense Refill   FLUoxetine (PROZAC) 20 MG capsule Take 1 capsule (20 mg total) by mouth daily. 90 capsule 1   No current facility-administered medications for this visit.    Medication Side Effects: None  Allergies:  Allergies  Allergen Reactions   Sulfa Antibiotics Rash    hives   Sulfasalazine Rash    Past Medical History:  Diagnosis Date   Renal disorder    ureter    No family history on file.  Social History   Socioeconomic History   Marital status: Married    Spouse name: Not on file   Number of children: Not on file   Years of education: Not on file   Highest education level: Not on file  Occupational History   Not on file  Tobacco Use   Smoking status: Never   Smokeless tobacco: Never  Substance and Sexual Activity   Alcohol use: Not Currently   Drug use: Never   Sexual activity: Not on file  Other Topics Concern   Not on file  Social History Narrative   Not on file   Social Drivers of Health  Financial Resource Strain: Not on file  Food Insecurity: Not on file  Transportation Needs: Not on file  Physical Activity: Not on file  Stress: Not on file  Social Connections: Not on file  Intimate Partner Violence: Not on file    Past Medical History, Surgical history, Social history, and Family history were reviewed and updated as appropriate.   Please see review of systems for further details on the patient's review from today.   Objective:    Physical Exam:  There were no vitals taken for this visit.  Physical Exam Constitutional:      General: He is not in acute distress. Musculoskeletal:        General: No deformity.  Neurological:     Mental Status: He is alert and oriented to person, place, and time.     Coordination: Coordination normal.  Psychiatric:        Attention and Perception: Attention and perception normal. He does not perceive auditory or visual hallucinations.        Mood and Affect: Affect is not labile, blunt, angry or inappropriate.        Speech: Speech normal.        Behavior: Behavior normal.        Thought Content: Thought content normal. Thought content is not paranoid or delusional. Thought content does not include homicidal or suicidal ideation. Thought content does not include homicidal or suicidal plan.        Cognition and Memory: Cognition and memory normal.        Judgment: Judgment normal.     Comments: Insight intact     Lab Review:     Component Value Date/Time   NA 140 12/25/2017 2228   K 4.0 12/25/2017 2228   CL 109 12/25/2017 2228   CO2 23 12/25/2017 2228   GLUCOSE 102 (H) 12/25/2017 2228   BUN 20 12/25/2017 2228   CREATININE 1.09 12/25/2017 2228   CALCIUM 10.1 12/25/2017 2228   PROT 6.9 12/25/2017 2228   ALBUMIN 4.2 12/25/2017 2228   AST 69 (H) 12/25/2017 2228   ALT 191 (H) 12/25/2017 2228   ALKPHOS 73 12/25/2017 2228   BILITOT 0.4 12/25/2017 2228   GFRNONAA >60 12/25/2017 2228   GFRAA >60 12/25/2017 2228       Component Value Date/Time   WBC 8.1 12/25/2017 2228   RBC 5.47 12/25/2017 2228   HGB 15.7 12/25/2017 2228   HCT 46.7 12/25/2017 2228   PLT 340 12/25/2017 2228   MCV 85.4 12/25/2017 2228   MCH 28.7 12/25/2017 2228   MCHC 33.6 12/25/2017 2228   RDW 12.3 12/25/2017 2228    No results found for: "POCLITH", "LITHIUM"   No results found for: "PHENYTOIN", "PHENOBARB", "VALPROATE", "CBMZ"   .res Assessment: Plan:    Plan:  PDMP reviewed  Prozac  20mg  daily   RTC 3/6 months  Patient advised to contact office with any questions, adverse effects, or acute worsening in signs and symptoms.  10 minutes spent dedicated to the care of this patient on the date of this encounter to include pre-visit review of records, ordering of medication, post visit documentation, and face-to-face time with the patient discussing OCD. Discussed continuing current medication regimen.   Diagnoses and all orders for this visit:  Obsessive-compulsive disorder, unspecified type -     FLUoxetine (PROZAC) 20 MG capsule; Take 1 capsule (20 mg total) by mouth daily.     Please see After Visit Summary for patient specific instructions.  No  future appointments.  No orders of the defined types were placed in this encounter.     -------------------------------

## 2023-12-04 ENCOUNTER — Ambulatory Visit (INDEPENDENT_AMBULATORY_CARE_PROVIDER_SITE_OTHER): Payer: Self-pay | Admitting: Adult Health

## 2023-12-04 DIAGNOSIS — Z0389 Encounter for observation for other suspected diseases and conditions ruled out: Secondary | ICD-10-CM

## 2023-12-04 NOTE — Progress Notes (Deleted)
 Justin Krueger [id number] October 17, 1995 28 y.o.  Subjective:   Patient ID:  Justin Krueger is a 28 y.o. (DOB [birth year]) male.  Chief Complaint: No chief complaint on file.   HPI SHADY BRADISH presents to the office today for follow-up of ***      Review of Systems:  Review of Systems  Medications: {medication reviewed/display:3041432}  Current Outpatient Medications  Medication Sig Dispense Refill   FLUoxetine  (PROZAC ) 20 MG capsule Take 1 capsule (20 mg total) by mouth daily. 90 capsule 1   No current facility-administered medications for this visit.    Medication Side Effects: {Medication Side Effects (Optional):21014029}  Allergies:  Allergies  Allergen Reactions   Sulfa Antibiotics Rash    hives   Sulfasalazine Rash    Past Medical History:  Diagnosis Date   Renal disorder    ureter    Past Medical History, Surgical history, Social history, and Family history were reviewed and updated as appropriate.   Please see review of systems for further details on the patient's review from today.   Objective:   Physical Exam:  There were no vitals taken for this visit.  Physical Exam  Lab Review:     Component Value Date/Time   NA 140 12/25/2017 2228   K 4.0 12/25/2017 2228   CL 109 12/25/2017 2228   CO2 23 12/25/2017 2228   GLUCOSE 102 (H) 12/25/2017 2228   BUN 20 12/25/2017 2228   CREATININE 1.09 12/25/2017 2228   CALCIUM 10.1 12/25/2017 2228   PROT 6.9 12/25/2017 2228   ALBUMIN 4.2 12/25/2017 2228   AST 69 (H) 12/25/2017 2228   ALT 191 (H) 12/25/2017 2228   ALKPHOS 73 12/25/2017 2228   BILITOT 0.4 12/25/2017 2228   GFRNONAA >60 12/25/2017 2228   GFRAA >60 12/25/2017 2228       Component Value Date/Time   WBC 8.1 12/25/2017 2228   RBC 5.47 12/25/2017 2228   HGB 15.7 12/25/2017 2228   HCT 46.7 12/25/2017 2228   PLT 340 12/25/2017 2228   MCV 85.4 12/25/2017 2228   MCH 28.7 12/25/2017 2228   MCHC 33.6 12/25/2017 2228   RDW 12.3 12/25/2017  2228    No results found for: POCLITH, LITHIUM   No results found for: PHENYTOIN, PHENOBARB, VALPROATE, CBMZ   .res Assessment: Plan:    There are no diagnoses linked to this encounter.   Please see After Visit Summary for patient specific instructions.  Future Appointments  Date Time Provider Department Center  12/04/2023  9:30 AM Malky Rudzinski Nattalie, NP [facility] None    No orders of the defined types were placed in this encounter.   -------------------------------

## 2023-12-04 NOTE — Progress Notes (Signed)
 Patient no show appointment. ? ?
